# Patient Record
Sex: Female | Born: 1935 | Race: White | Hispanic: No
Health system: Southern US, Community
[De-identification: ages and names within clinical notes are randomized; demographics above are authoritative.]

---

## 2020-01-18 ENCOUNTER — Other Ambulatory Visit (HOSPITAL_COMMUNITY): Payer: Self-pay

## 2020-01-18 ENCOUNTER — Inpatient Hospital Stay
Admission: AD | Admit: 2020-01-18 | Discharge: 2020-02-01 | Disposition: A | Discharge status: Home Health | Payer: Medicare Other | Source: Other Acute Inpatient Hospital | Attending: Internal Medicine | Admitting: Internal Medicine

## 2020-01-18 ENCOUNTER — Inpatient Hospital Stay
Admission: AD | Admit: 2020-01-18 | Payer: Self-pay | Source: Other Acute Inpatient Hospital | Admitting: Internal Medicine

## 2020-01-18 DIAGNOSIS — J189 Pneumonia, unspecified organism: Secondary | ICD-10-CM

## 2020-01-18 DIAGNOSIS — J9690 Respiratory failure, unspecified, unspecified whether with hypoxia or hypercapnia: Secondary | ICD-10-CM

## 2020-01-18 DIAGNOSIS — F419 Anxiety disorder, unspecified: Secondary | ICD-10-CM

## 2020-01-18 DIAGNOSIS — J9621 Acute and chronic respiratory failure with hypoxia: Secondary | ICD-10-CM

## 2020-01-18 DIAGNOSIS — N179 Acute kidney failure, unspecified: Secondary | ICD-10-CM

## 2020-01-18 DIAGNOSIS — A419 Sepsis, unspecified organism: Secondary | ICD-10-CM

## 2020-01-19 DIAGNOSIS — J9621 Acute and chronic respiratory failure with hypoxia: Secondary | ICD-10-CM | POA: Diagnosis not present

## 2020-01-19 DIAGNOSIS — N189 Chronic kidney disease, unspecified: Secondary | ICD-10-CM | POA: Diagnosis not present

## 2020-01-19 DIAGNOSIS — N179 Acute kidney failure, unspecified: Secondary | ICD-10-CM | POA: Diagnosis not present

## 2020-01-19 LAB — CBC
HCT: 28.1 % — ABNORMAL LOW (ref 36.0–46.0)
Hemoglobin: 8.5 g/dL — ABNORMAL LOW (ref 12.0–15.0)
MCH: 28.8 pg (ref 26.0–34.0)
MCHC: 30.2 g/dL (ref 30.0–36.0)
MCV: 95.3 fL (ref 80.0–100.0)
Platelets: 234 10*3/uL (ref 150–400)
RBC: 2.95 MIL/uL — ABNORMAL LOW (ref 3.87–5.11)
RDW: 18.5 % — ABNORMAL HIGH (ref 11.5–15.5)
WBC: 12 10*3/uL — ABNORMAL HIGH (ref 4.0–10.5)
nRBC: 0 % (ref 0.0–0.2)

## 2020-01-19 LAB — PROTIME-INR
INR: 1.1 (ref 0.8–1.2)
Prothrombin Time: 14 seconds (ref 11.4–15.2)

## 2020-01-19 LAB — COMPREHENSIVE METABOLIC PANEL
ALT: 21 U/L (ref 0–44)
AST: 26 U/L (ref 15–41)
Albumin: 2.5 g/dL — ABNORMAL LOW (ref 3.5–5.0)
Alkaline Phosphatase: 77 U/L (ref 38–126)
Anion gap: 9 (ref 5–15)
BUN: 58 mg/dL — ABNORMAL HIGH (ref 8–23)
CO2: 16 mmol/L — ABNORMAL LOW (ref 22–32)
Calcium: 8.5 mg/dL — ABNORMAL LOW (ref 8.9–10.3)
Chloride: 112 mmol/L — ABNORMAL HIGH (ref 98–111)
Creatinine, Ser: 3.08 mg/dL — ABNORMAL HIGH (ref 0.44–1.00)
GFR calc Af Amer: 16 mL/min — ABNORMAL LOW (ref >60)
GFR calc non Af Amer: 13 mL/min — ABNORMAL LOW (ref >60)
Glucose, Bld: 106 mg/dL — ABNORMAL HIGH (ref 70–99)
Potassium: 5.3 mmol/L — ABNORMAL HIGH (ref 3.5–5.1)
Sodium: 137 mmol/L (ref 135–145)
Total Bilirubin: 0.4 mg/dL (ref 0.3–1.2)
Total Protein: 5.5 g/dL — ABNORMAL LOW (ref 6.5–8.1)

## 2020-01-19 NOTE — Consult Note (Signed)
Pulmonary Critical Care Medicine [facility] [place]  PULMONARY SERVICE  Date of Service: 01/19/2020  PULMONARY CRITICAL CARE CONSULT   Linda Wolf  DUK:[id number]  DOB: 1936-01-26   DOA: 01/18/2020  Referring Physician: Carron Curie, MD  HPI: Linda Wolf is a 84 y.o. female seen for follow up of Acute on Chronic Respiratory Failure.  Patient has multiple medical problems including hypertension depression DVT anal cancer presented to the hospital because of increasing respiratory failure.  Patient was found to be septic suspected to have aspiration pneumonia and was treated as such.  Patient had a CT scan as well as a VQ scan for pulmonary embolism which was ruled out.  Also underwent a MRCP without any defects noted.  Apparently urine cultures grew E. coli so patient was treated with antibiotics.  Patient was given a trial of steroids had no major improvement renal function also continued to deteriorate despite management patient was continued on high flow oxygen and presents here for further management  Review of Systems:  ROS performed and is unremarkable other than noted above.  Past medical history hypertension renal failure sepsis anxiety Hyperlipidemia Hypothyroidism Lower extremity swelling Acute renal failure  Past surgical history: Noncontributory  Social history: Positive for tobacco No alcohol or drug abuse  Family history: Noncontributory  Allergies reviewed on the [facility]  Medications: Reviewed on Rounds  Physical Exam:  Vitals: Temperature is 95.8 pulse 85 respiratory 19 blood pressure is 129/81 saturations 100%  Ventilator Settings on high flow oxygen  . General: Comfortable at this time . Eyes: Grossly normal lids, irises & conjunctiva . ENT: grossly tongue is normal . Neck: no obvious mass . Cardiovascular: S1-S2 normal no gallop or rub . Respiratory: Coarse breath sounds noted bilaterally . Abdomen: Soft nontender . Skin: no rash seen  on limited exam . Musculoskeletal: not rigid . Psychiatric:unable to assess . Neurologic: no seizure no involuntary movements         Labs on Admission:  Basic Metabolic Panel: Recent Labs  Lab 01/19/20 0658  NA 137  K 5.3*  CL 112*  CO2 16*  GLUCOSE 106*  BUN 58*  CREATININE 3.08*  CALCIUM 8.5*    No results for input(s): PHART, PCO2ART, PO2ART, HCO3, O2SAT in the last 168 hours.  Liver Function Tests: Recent Labs  Lab 01/19/20 0658  AST 26  ALT 21  ALKPHOS 77  BILITOT 0.4  PROT 5.5*  ALBUMIN 2.5*   No results for input(s): LIPASE, AMYLASE in the last 168 hours. No results for input(s): AMMONIA in the last 168 hours.  CBC: Recent Labs  Lab 01/19/20 0658  WBC 12.0*  HGB 8.5*  HCT 28.1*  MCV 95.3  PLT 234    Cardiac Enzymes: No results for input(s): CKTOTAL, CKMB, CKMBINDEX, TROPONINI in the last 168 hours.  BNP (last 3 results) No results for input(s): BNP in the last 8760 hours.  ProBNP (last 3 results) No results for input(s): PROBNP in the last 8760 hours.   Radiological Exams on Admission: DG Chest Port 1 View  Result Date: 01/18/2020 CLINICAL DATA:  84 year old female with respiratory failure. EXAM: PORTABLE CHEST 1 VIEW COMPARISON:  None. FINDINGS: Small bilateral pleural effusions and bibasilar opacities which may represent atelectasis or infiltrate. No pneumothorax. There is mild cardiomegaly. Atherosclerotic calcification of the aorta. No acute osseous pathology. IMPRESSION: Small bilateral pleural effusions and bibasilar atelectasis or infiltrate. Electronically Signed   By: Elgie Collard M.D.   On: 01/18/2020 18:21    Assessment/Plan Principal  Problem:   Acute on chronic respiratory failure with hypoxia (HCC) Active Problems:   Severe sepsis (HCC)   Acute renal failure superimposed on chronic kidney disease (HCC)   Anxiety disorder   1. Acute on chronic respiratory failure with hypoxia right now patient is on diltiazem on current  settings.  Chest x-ray looks good with small bilateral effusions may be a basilar infiltrate noted. 2. Severe sepsis treated resolved we will continue with supportive care patient apparently completed antibiotics. 3. Acute on chronic renal failure slow improvement we will monitor the patient's fluid status as well as labs. 4. Anxiety disorder patient is on medical management for this we will continue to follow  I have personally seen and evaluated the patient, evaluated laboratory and imaging results, formulated the assessment and plan and placed orders. The Patient requires high complexity decision making with multiple systems involvement.  Case was discussed on Rounds with the Respiratory Therapy Director and the Respiratory staff Time Spent  Yevonne Pax, MD [facility] Pulmonary Critical Care Medicine Sleep Medicine

## 2020-01-20 DIAGNOSIS — N179 Acute kidney failure, unspecified: Secondary | ICD-10-CM | POA: Diagnosis not present

## 2020-01-20 DIAGNOSIS — A419 Sepsis, unspecified organism: Secondary | ICD-10-CM | POA: Diagnosis not present

## 2020-01-20 DIAGNOSIS — F419 Anxiety disorder, unspecified: Secondary | ICD-10-CM | POA: Diagnosis not present

## 2020-01-20 DIAGNOSIS — J9621 Acute and chronic respiratory failure with hypoxia: Secondary | ICD-10-CM | POA: Diagnosis not present

## 2020-01-20 LAB — BASIC METABOLIC PANEL
Anion gap: 10 (ref 5–15)
BUN: 57 mg/dL — ABNORMAL HIGH (ref 8–23)
CO2: 17 mmol/L — ABNORMAL LOW (ref 22–32)
Calcium: 8.7 mg/dL — ABNORMAL LOW (ref 8.9–10.3)
Chloride: 112 mmol/L — ABNORMAL HIGH (ref 98–111)
Creatinine, Ser: 2.89 mg/dL — ABNORMAL HIGH (ref 0.44–1.00)
GFR calc Af Amer: 17 mL/min — ABNORMAL LOW (ref >60)
GFR calc non Af Amer: 15 mL/min — ABNORMAL LOW (ref >60)
Glucose, Bld: 91 mg/dL (ref 70–99)
Potassium: 4.8 mmol/L (ref 3.5–5.1)
Sodium: 139 mmol/L (ref 135–145)

## 2020-01-21 ENCOUNTER — Encounter: Payer: Self-pay | Admitting: Internal Medicine

## 2020-01-21 DIAGNOSIS — R6520 Severe sepsis without septic shock: Secondary | ICD-10-CM

## 2020-01-21 DIAGNOSIS — J9621 Acute and chronic respiratory failure with hypoxia: Secondary | ICD-10-CM

## 2020-01-21 DIAGNOSIS — A419 Sepsis, unspecified organism: Secondary | ICD-10-CM

## 2020-01-21 DIAGNOSIS — N179 Acute kidney failure, unspecified: Secondary | ICD-10-CM

## 2020-01-21 DIAGNOSIS — N189 Chronic kidney disease, unspecified: Secondary | ICD-10-CM

## 2020-01-21 DIAGNOSIS — F419 Anxiety disorder, unspecified: Secondary | ICD-10-CM

## 2020-01-21 HISTORY — DX: Severe sepsis without septic shock: R65.20

## 2020-01-21 HISTORY — DX: Acute kidney failure, unspecified: N17.9

## 2020-01-21 HISTORY — DX: Anxiety disorder, unspecified: F41.9

## 2020-01-21 HISTORY — DX: Chronic kidney disease, unspecified: N18.9

## 2020-01-21 HISTORY — DX: Acute and chronic respiratory failure with hypoxia: J96.21

## 2020-01-21 HISTORY — DX: Sepsis, unspecified organism: A41.9

## 2020-01-21 LAB — POTASSIUM: Potassium: 6.2 mmol/L — ABNORMAL HIGH (ref 3.5–5.1)

## 2020-01-22 ENCOUNTER — Other Ambulatory Visit (HOSPITAL_COMMUNITY): Payer: Self-pay

## 2020-01-22 DIAGNOSIS — J9621 Acute and chronic respiratory failure with hypoxia: Secondary | ICD-10-CM | POA: Diagnosis not present

## 2020-01-22 DIAGNOSIS — A419 Sepsis, unspecified organism: Secondary | ICD-10-CM | POA: Diagnosis not present

## 2020-01-22 DIAGNOSIS — F419 Anxiety disorder, unspecified: Secondary | ICD-10-CM | POA: Diagnosis not present

## 2020-01-22 DIAGNOSIS — R6520 Severe sepsis without septic shock: Secondary | ICD-10-CM

## 2020-01-22 DIAGNOSIS — N179 Acute kidney failure, unspecified: Secondary | ICD-10-CM | POA: Diagnosis not present

## 2020-01-22 LAB — BASIC METABOLIC PANEL
Anion gap: 9 (ref 5–15)
BUN: 50 mg/dL — ABNORMAL HIGH (ref 8–23)
CO2: 23 mmol/L (ref 22–32)
Calcium: 8.2 mg/dL — ABNORMAL LOW (ref 8.9–10.3)
Chloride: 108 mmol/L (ref 98–111)
Creatinine, Ser: 2.4 mg/dL — ABNORMAL HIGH (ref 0.44–1.00)
GFR calc Af Amer: 21 mL/min — ABNORMAL LOW (ref >60)
GFR calc non Af Amer: 18 mL/min — ABNORMAL LOW (ref >60)
Glucose, Bld: 122 mg/dL — ABNORMAL HIGH (ref 70–99)
Potassium: 4.4 mmol/L (ref 3.5–5.1)
Sodium: 140 mmol/L (ref 135–145)

## 2020-01-22 NOTE — Consult Note (Signed)
[facility] CONSULT NOTE    Date: 01/22/2020                  Patient Name:  Linda Wolf  MRN: [id number]  DOB: [birth year]  Age / Sex: 84 y.o., female         PCP: Patient, No Pcp Per                 Service Requesting Consult:  Hospitalist                 Reason for Consult:  Acute kidney injury/hyperkalemia            History of Present Illness: Patient is a 84 y.o. female with a PMHx of hypertension, hyperlipidemia, hypothyroidism, depression, DVT, rectal cancer, recent respiratory failure, UTI, who was admitted to [facility] on 01/18/2020 for ongoing treatment.  Patient is a poor historian and cannot offer significant details regarding her present illness.  She presented to outside hospital on 01/08/2020 with shortness of breath with a sepsis-like picture.  CT of the chest showed bibasilar consolidation at that time.  She was started on Rocephin and Flagyl.  Subsequently blood cultures were positive for E. coli and urine culture was positive for Citrobacter.  She then developed acute kidney injury and was started on IV fluid hydration.  Upon evaluation here her initial creatinine was 3.08.  It did come down a bit on 01/20/2019 down to 2.89.  Yesterday potassium was noted to be a bit high at 6.2.   Medications:  Current medications: D5 half-normal saline, levofloxacin 500 mg IV every 48 hours,    Allergies: No known drug allergies   Past Medical History: Past Medical History:  Diagnosis Date  . Acute on chronic respiratory failure with hypoxia (HCC) 01/21/2020  . Acute renal failure superimposed on chronic kidney disease (HCC) 01/21/2020  . Anxiety disorder 01/21/2020  . Severe sepsis (HCC) 01/21/2020  Hypertension Depression Rectal cancer DVT   Past Surgical History: Unable to obtain from the patient given poor historian status  Family History: Unable to obtain from the patient given poor historian status  Social History: Unable to  obtain from the patient given poor historian status  Review of Systems: Unable to obtain from the patient given poor historian status  Vital Signs: Temperature 96.4 pulse 68 respirations 13 blood pressure 147/84  Weight trends: There were no vitals filed for this visit.   Physical Exam: General:  No acute distress  Head:  Normocephalic, atraumatic. Moist oral mucosal membranes  Eyes:  Anicteric  Neck:  Supple  Lungs:   Clear to auscultation, normal effort  Heart:  S1S2 no rubs  Abdomen:   Soft, nontender, bowel sounds present  Extremities:  1+ peripheral edema.  Neurologic:  Awake, alert, confused  Skin:  No lesions       Lab results: Basic Metabolic Panel: Recent Labs  Lab 01/19/20 0658 01/20/20 0639 01/21/20 1257  NA 137 139  --   K 5.3* 4.8 6.2*  CL 112* 112*  --   CO2 16* 17*  --   GLUCOSE 106* 91  --   BUN 58* 57*  --   CREATININE 3.08* 2.89*  --   CALCIUM 8.5* 8.7*  --     Liver Function Tests: Recent Labs  Lab 01/19/20 0658  AST 26  ALT 21  ALKPHOS 77  BILITOT 0.4  PROT 5.5*  ALBUMIN 2.5*   No results for input(s): LIPASE, AMYLASE in the last  168 hours. No results for input(s): AMMONIA in the last 168 hours.  CBC: Recent Labs  Lab 01/19/20 0658  WBC 12.0*  HGB 8.5*  HCT 28.1*  MCV 95.3  PLT 234    Cardiac Enzymes: No results for input(s): CKTOTAL, CKMB, CKMBINDEX, TROPONINI in the last 168 hours.  BNP: Invalid input(s): POCBNP  CBG: No results for input(s): GLUCAP in the last 168 hours.  Microbiology: No results found for this or any previous visit.  Coagulation Studies: No results for input(s): LABPROT, INR in the last 72 hours.  Urinalysis: No results for input(s): COLORURINE, LABSPEC, PHURINE, GLUCOSEU, HGBUR, BILIRUBINUR, KETONESUR, PROTEINUR, UROBILINOGEN, NITRITE, LEUKOCYTESUR in the last 72 hours.  Invalid input(s): APPERANCEUR    Imaging:  No results found.   Assessment & Plan: Pt is a 84 y.o. female with a  PMHx of hypertension, hyperlipidemia, hypothyroidism, depression, DVT, rectal cancer, recent respiratory failure, UTI, who was admitted to [facility] on 01/18/2020 for ongoing treatment.   1.  Acute kidney injury.  Baseline creatinine unknown to [place] at this time.  Creatinine currently 2.89.  Continue IV fluid hydration.  Check renal ultrasound to make sure no underlying obstruction.  2.  Hyperkalemia.  Serum potassium high at 6.2.  Start the patient on Lokelma 10 g p.o. daily.  Metabolic acidosis is also contributing.  3.  Metabolic acidosis.  Most recent serum bicarbonate was 17.  We plan to repeat this today.  Hold off on adding bicarbonate repletion for now.  4.  Thanks for consultation.

## 2020-01-22 NOTE — Progress Notes (Signed)
Pulmonary Critical Care Medicine [facility] [place]   PULMONARY CRITICAL CARE SERVICE  PROGRESS NOTE  Date of Service: 01/22/2020  Linda Wolf  FXT:[id number]  DOB: [birth year]   DOA: 01/18/2020  Referring Physician: Carron Curie, MD  HPI: Linda Wolf is a 84 y.o. female seen for follow up of Acute on Chronic Respiratory Failure.  Patient is on nasal cannula currently on 6 L of O2 good saturations are noted.  Medications: Reviewed on Rounds  Physical Exam:  Vitals: Temperature 96.4 pulse 60 respiratory 13 blood pressure is 147/84 saturations 96%  Ventilator Settings off the ventilator on nasal cannula  . General: Comfortable at this time . Eyes: Grossly normal lids, irises & conjunctiva . ENT: grossly tongue is normal . Neck: no obvious mass . Cardiovascular: S1 S2 normal no gallop . Respiratory: No rhonchi no rales noted at this time . Abdomen: soft . Skin: no rash seen on limited exam . Musculoskeletal: not rigid . Psychiatric:unable to assess . Neurologic: no seizure no involuntary movements         Lab Data:   Basic Metabolic Panel: Recent Labs  Lab 01/19/20 0658 01/20/20 0639 01/21/20 1257  NA 137 139  --   K 5.3* 4.8 6.2*  CL 112* 112*  --   CO2 16* 17*  --   GLUCOSE 106* 91  --   BUN 58* 57*  --   CREATININE 3.08* 2.89*  --   CALCIUM 8.5* 8.7*  --     ABG: No results for input(s): PHART, PCO2ART, PO2ART, HCO3, O2SAT in the last 168 hours.  Liver Function Tests: Recent Labs  Lab 01/19/20 0658  AST 26  ALT 21  ALKPHOS 77  BILITOT 0.4  PROT 5.5*  ALBUMIN 2.5*   No results for input(s): LIPASE, AMYLASE in the last 168 hours. No results for input(s): AMMONIA in the last 168 hours.  CBC: Recent Labs  Lab 01/19/20 0658  WBC 12.0*  HGB 8.5*  HCT 28.1*  MCV 95.3  PLT 234    Cardiac Enzymes: No results for input(s): CKTOTAL, CKMB, CKMBINDEX, TROPONINI in the last 168 hours.  BNP (last 3 results) No results for  input(s): BNP in the last 8760 hours.  ProBNP (last 3 results) No results for input(s): PROBNP in the last 8760 hours.  Radiological Exams: No results found.  Assessment/Plan Principal Problem:   Acute on chronic respiratory failure with hypoxia (HCC) Active Problems:   Severe sepsis (HCC)   Acute renal failure superimposed on chronic kidney disease (HCC)   Anxiety disorder   1. Acute on chronic respiratory failure with hypoxia plan is to continue to wean FiO2 now is on regular nasal cannula. 2. Severe sepsis resolved 3. Acute renal failure following the labs patient's last creatinine was 2.89 4. Anxiety disorder at baseline we will continue to monitor   I have personally seen and evaluated the patient, evaluated laboratory and imaging results, formulated the assessment and plan and placed orders. The Patient requires high complexity decision making with multiple systems involvement.  Rounds were done with the Respiratory Therapy Director and Staff therapists and discussed with nursing staff also.  Yevonne Pax, MD [facility] Pulmonary Critical Care Medicine Sleep Medicine

## 2020-01-23 LAB — PROTEIN ELECTROPHORESIS, SERUM
A/G Ratio: 1 (ref 0.7–1.7)
Albumin ELP: 2.8 g/dL — ABNORMAL LOW (ref 2.9–4.4)
Alpha-1-Globulin: 0.3 g/dL (ref 0.0–0.4)
Alpha-2-Globulin: 0.8 g/dL (ref 0.4–1.0)
Beta Globulin: 0.8 g/dL (ref 0.7–1.3)
Gamma Globulin: 0.8 g/dL (ref 0.4–1.8)
Globulin, Total: 2.8 g/dL (ref 2.2–3.9)
Total Protein ELP: 5.6 g/dL — ABNORMAL LOW (ref 6.0–8.5)

## 2020-01-23 LAB — ANA W/REFLEX IF POSITIVE: Anti Nuclear Antibody (ANA): NEGATIVE

## 2020-01-23 NOTE — Progress Notes (Addendum)
Pulmonary Critical Care Medicine [facility] [place]   PULMONARY CRITICAL CARE SERVICE  PROGRESS NOTE  Date of Service: 01/23/2020  Lorra Freeman  PYK:[id number]  DOB: Nov 23, 1935   DOA: 01/18/2020  Referring Physician: Carron Curie, MD  HPI: Wileen Duncanson is a 84 y.o. female seen for follow up of Acute on Chronic Respiratory Failure.  Patient remains on 8 L Oxymizer at this time satting well with no fever or distress.  Medications: Reviewed on Rounds  Physical Exam:  Vitals: Pulse 91 respirations 19 BP 161/66 O2 sat 100% temp 96.5  Ventilator Settings 8 L Oxymizer  . General: Comfortable at this time . Eyes: Grossly normal lids, irises & conjunctiva . ENT: grossly tongue is normal . Neck: no obvious mass . Cardiovascular: S1 S2 normal no gallop . Respiratory: No rales or rhonchi noted . Abdomen: soft . Skin: no rash seen on limited exam . Musculoskeletal: not rigid . Psychiatric:unable to assess . Neurologic: no seizure no involuntary movements         Lab Data:   Basic Metabolic Panel: Recent Labs  Lab 01/19/20 0658 01/20/20 0639 01/21/20 1257 01/22/20 1245  NA 137 139  --  140  K 5.3* 4.8 6.2* 4.4  CL 112* 112*  --  108  CO2 16* 17*  --  23  GLUCOSE 106* 91  --  122*  BUN 58* 57*  --  50*  CREATININE 3.08* 2.89*  --  2.40*  CALCIUM 8.5* 8.7*  --  8.2*    ABG: No results for input(s): PHART, PCO2ART, PO2ART, HCO3, O2SAT in the last 168 hours.  Liver Function Tests: Recent Labs  Lab 01/19/20 0658  AST 26  ALT 21  ALKPHOS 77  BILITOT 0.4  PROT 5.5*  ALBUMIN 2.5*   No results for input(s): LIPASE, AMYLASE in the last 168 hours. No results for input(s): AMMONIA in the last 168 hours.  CBC: Recent Labs  Lab 01/19/20 0658  WBC 12.0*  HGB 8.5*  HCT 28.1*  MCV 95.3  PLT 234    Cardiac Enzymes: No results for input(s): CKTOTAL, CKMB, CKMBINDEX, TROPONINI in the last 168 hours.  BNP (last 3 results) No results for input(s):  BNP in the last 8760 hours.  ProBNP (last 3 results) No results for input(s): PROBNP in the last 8760 hours.  Radiological Exams: US RENAL  Result Date: 01/22/2020 CLINICAL DATA:  Acute renal injury EXAM: RENAL / URINARY TRACT ULTRASOUND COMPLETE COMPARISON:  None. FINDINGS: Right Kidney: Renal measurements: 8.3 x 4.0 x 4.6 cm. = volume: 80.5 mL. Mild increased echogenicity is noted. Mild cortical thinning is seen. No obstructive changes are noted. Left Kidney: Renal measurements: 8.4 x 3.7 x 4.2 cm. = volume: 65 mL. Mild increased echogenicity is noted. Mild cortical thinning is noted. No obstructive changes are noted. Bladder: Decompressed by Foley catheter. Other: None. IMPRESSION: Changes of medical renal disease.  No acute abnormality noted. Electronically Signed   By: Alcide Clever M.D.   On: 01/22/2020 19:11    Assessment/Plan Principal Problem:   Acute on chronic respiratory failure with hypoxia (HCC) Active Problems:   Severe sepsis (HCC)   Acute renal failure superimposed on chronic kidney disease (HCC)   Anxiety disorder   1. Acute on chronic respiratory failure with hypoxia continue to wean oxygen as tolerated.  Currently on 8 L Oxymizer satting well continue supportive measures and pulmonary toilet. 2. Severe sepsis treated resolved we will continue with supportive care patient apparently completed antibiotics. 3. Acute  on chronic renal failure slow improvement we will monitor the patient's fluid status as well as labs. 4. Anxiety disorder patient is on medical management for this we will continue to follow   I have personally seen and evaluated the patient, evaluated laboratory and imaging results, formulated the assessment and plan and placed orders. The Patient requires high complexity decision making with multiple systems involvement.  Rounds were done with the Respiratory Therapy Director and Staff therapists and discussed with nursing staff also.  Yevonne Pax, MD  [facility] Pulmonary Critical Care Medicine Sleep Medicine

## 2020-01-24 ENCOUNTER — Other Ambulatory Visit (HOSPITAL_COMMUNITY): Payer: Self-pay

## 2020-01-24 DIAGNOSIS — F419 Anxiety disorder, unspecified: Secondary | ICD-10-CM | POA: Diagnosis not present

## 2020-01-24 DIAGNOSIS — J9621 Acute and chronic respiratory failure with hypoxia: Secondary | ICD-10-CM | POA: Diagnosis not present

## 2020-01-24 DIAGNOSIS — N179 Acute kidney failure, unspecified: Secondary | ICD-10-CM | POA: Diagnosis not present

## 2020-01-24 DIAGNOSIS — A419 Sepsis, unspecified organism: Secondary | ICD-10-CM | POA: Diagnosis not present

## 2020-01-24 LAB — CBC
HCT: 26.7 % — ABNORMAL LOW (ref 36.0–46.0)
Hemoglobin: 8.3 g/dL — ABNORMAL LOW (ref 12.0–15.0)
MCH: 29.1 pg (ref 26.0–34.0)
MCHC: 31.1 g/dL (ref 30.0–36.0)
MCV: 93.7 fL (ref 80.0–100.0)
Platelets: 212 10*3/uL (ref 150–400)
RBC: 2.85 MIL/uL — ABNORMAL LOW (ref 3.87–5.11)
RDW: 17.6 % — ABNORMAL HIGH (ref 11.5–15.5)
WBC: 8.6 10*3/uL (ref 4.0–10.5)
nRBC: 0 % (ref 0.0–0.2)

## 2020-01-24 LAB — BASIC METABOLIC PANEL
Anion gap: 9 (ref 5–15)
BUN: 50 mg/dL — ABNORMAL HIGH (ref 8–23)
CO2: 23 mmol/L (ref 22–32)
Calcium: 8.1 mg/dL — ABNORMAL LOW (ref 8.9–10.3)
Chloride: 110 mmol/L (ref 98–111)
Creatinine, Ser: 2.15 mg/dL — ABNORMAL HIGH (ref 0.44–1.00)
GFR calc Af Amer: 24 mL/min — ABNORMAL LOW (ref >60)
GFR calc non Af Amer: 21 mL/min — ABNORMAL LOW (ref >60)
Glucose, Bld: 98 mg/dL (ref 70–99)
Potassium: 4.1 mmol/L (ref 3.5–5.1)
Sodium: 142 mmol/L (ref 135–145)

## 2020-01-24 LAB — PROTEIN / CREATININE RATIO, URINE
Creatinine, Urine: 51.32 mg/dL
Protein Creatinine Ratio: 0.58 mg/mg{Cre} — ABNORMAL HIGH (ref 0.00–0.15)
Total Protein, Urine: 30 mg/dL

## 2020-01-24 NOTE — Progress Notes (Signed)
Pulmonary Critical Care Medicine [facility] [place]   PULMONARY CRITICAL CARE SERVICE  PROGRESS NOTE  Date of Service: 01/24/2020  Linda Wolf  POE:[id number]  DOB: 1936-02-16   DOA: 01/18/2020  Referring Physician: Carron Curie, MD  HPI: Linda Wolf is a 84 y.o. female seen for follow up of Acute on Chronic Respiratory Failure.  Patient is off the ventilator on 2 L of oxygen good saturations are noted.  Medications: Reviewed on Rounds  Physical Exam:  Vitals: Temperature 96.5 pulse 90 respiratory 14 blood pressure is 121/78 saturations 93%  Ventilator Settings 2 L of O2  . General: Comfortable at this time . Eyes: Grossly normal lids, irises & conjunctiva . ENT: grossly tongue is normal . Neck: no obvious mass . Cardiovascular: S1 S2 normal no gallop . Respiratory: No rhonchi coarse breath sounds . Abdomen: soft . Skin: no rash seen on limited exam . Musculoskeletal: not rigid . Psychiatric:unable to assess . Neurologic: no seizure no involuntary movements         Lab Data:   Basic Metabolic Panel: Recent Labs  Lab 01/19/20 0658 01/20/20 0639 01/21/20 1257 01/22/20 1245 01/24/20 1112  NA 137 139  --  140 142  K 5.3* 4.8 6.2* 4.4 4.1  CL 112* 112*  --  108 110  CO2 16* 17*  --  23 23  GLUCOSE 106* 91  --  122* 98  BUN 58* 57*  --  50* 50*  CREATININE 3.08* 2.89*  --  2.40* 2.15*  CALCIUM 8.5* 8.7*  --  8.2* 8.1*    ABG: No results for input(s): PHART, PCO2ART, PO2ART, HCO3, O2SAT in the last 168 hours.  Liver Function Tests: Recent Labs  Lab 01/19/20 0658  AST 26  ALT 21  ALKPHOS 77  BILITOT 0.4  PROT 5.5*  ALBUMIN 2.5*   No results for input(s): LIPASE, AMYLASE in the last 168 hours. No results for input(s): AMMONIA in the last 168 hours.  CBC: Recent Labs  Lab 01/19/20 0658 01/24/20 1112  WBC 12.0* 8.6  HGB 8.5* 8.3*  HCT 28.1* 26.7*  MCV 95.3 93.7  PLT 234 212    Cardiac Enzymes: No results for input(s):  CKTOTAL, CKMB, CKMBINDEX, TROPONINI in the last 168 hours.  BNP (last 3 results) No results for input(s): BNP in the last 8760 hours.  ProBNP (last 3 results) No results for input(s): PROBNP in the last 8760 hours.  Radiological Exams: US RENAL  Result Date: 01/22/2020 CLINICAL DATA:  Acute renal injury EXAM: RENAL / URINARY TRACT ULTRASOUND COMPLETE COMPARISON:  None. FINDINGS: Right Kidney: Renal measurements: 8.3 x 4.0 x 4.6 cm. = volume: 80.5 mL. Mild increased echogenicity is noted. Mild cortical thinning is seen. No obstructive changes are noted. Left Kidney: Renal measurements: 8.4 x 3.7 x 4.2 cm. = volume: 65 mL. Mild increased echogenicity is noted. Mild cortical thinning is noted. No obstructive changes are noted. Bladder: Decompressed by Foley catheter. Other: None. IMPRESSION: Changes of medical renal disease.  No acute abnormality noted. Electronically Signed   By: Alcide Clever M.D.   On: 01/22/2020 19:11    Assessment/Plan Principal Problem:   Acute on chronic respiratory failure with hypoxia (HCC) Active Problems:   Severe sepsis (HCC)   Acute renal failure superimposed on chronic kidney disease (HCC)   Anxiety disorder   1. Acute on chronic respiratory failure hypoxia plan is to continue with oxygen therapy right now is on 2 L 2. Severe sepsis resolved hemodynamics are stable. 3.  Acute renal failure improving renal function 4. Anxiety disorder under control   I have personally seen and evaluated the patient, evaluated laboratory and imaging results, formulated the assessment and plan and placed orders. The Patient requires high complexity decision making with multiple systems involvement.  Rounds were done with the Respiratory Therapy Director and Staff therapists and discussed with nursing staff also.  Yevonne Pax, MD [facility] Pulmonary Critical Care Medicine Sleep Medicine

## 2020-01-25 ENCOUNTER — Other Ambulatory Visit (HOSPITAL_COMMUNITY): Payer: Self-pay

## 2020-01-25 LAB — PROTEIN ELECTRO, RANDOM URINE
Albumin ELP, Urine: 32.3 %
Alpha-1-Globulin, U: 9.5 %
Alpha-2-Globulin, U: 12.1 %
Beta Globulin, U: 24.1 %
Gamma Globulin, U: 22 %
Total Protein, Urine: 16.9 mg/dL

## 2020-01-26 LAB — BASIC METABOLIC PANEL
Anion gap: 10 (ref 5–15)
BUN: 38 mg/dL — ABNORMAL HIGH (ref 8–23)
CO2: 25 mmol/L (ref 22–32)
Calcium: 8.4 mg/dL — ABNORMAL LOW (ref 8.9–10.3)
Chloride: 107 mmol/L (ref 98–111)
Creatinine, Ser: 1.76 mg/dL — ABNORMAL HIGH (ref 0.44–1.00)
GFR calc Af Amer: 31 mL/min — ABNORMAL LOW (ref >60)
GFR calc non Af Amer: 26 mL/min — ABNORMAL LOW (ref >60)
Glucose, Bld: 114 mg/dL — ABNORMAL HIGH (ref 70–99)
Potassium: 4.3 mmol/L (ref 3.5–5.1)
Sodium: 142 mmol/L (ref 135–145)

## 2020-01-30 LAB — BASIC METABOLIC PANEL
Anion gap: 10 (ref 5–15)
BUN: 40 mg/dL — ABNORMAL HIGH (ref 8–23)
CO2: 25 mmol/L (ref 22–32)
Calcium: 8.5 mg/dL — ABNORMAL LOW (ref 8.9–10.3)
Chloride: 104 mmol/L (ref 98–111)
Creatinine, Ser: 1.74 mg/dL — ABNORMAL HIGH (ref 0.44–1.00)
GFR calc Af Amer: 31 mL/min — ABNORMAL LOW (ref >60)
GFR calc non Af Amer: 27 mL/min — ABNORMAL LOW (ref >60)
Glucose, Bld: 142 mg/dL — ABNORMAL HIGH (ref 70–99)
Potassium: 3.6 mmol/L (ref 3.5–5.1)
Sodium: 139 mmol/L (ref 135–145)

## 2020-01-30 LAB — CBC
HCT: 30.2 % — ABNORMAL LOW (ref 36.0–46.0)
Hemoglobin: 9.4 g/dL — ABNORMAL LOW (ref 12.0–15.0)
MCH: 29.1 pg (ref 26.0–34.0)
MCHC: 31.1 g/dL (ref 30.0–36.0)
MCV: 93.5 fL (ref 80.0–100.0)
Platelets: 219 10*3/uL (ref 150–400)
RBC: 3.23 MIL/uL — ABNORMAL LOW (ref 3.87–5.11)
RDW: 16.2 % — ABNORMAL HIGH (ref 11.5–15.5)
WBC: 7.4 10*3/uL (ref 4.0–10.5)
nRBC: 0 % (ref 0.0–0.2)

## 2020-01-31 LAB — SARS CORONAVIRUS 2 (TAT 6-24 HRS): SARS Coronavirus 2: NEGATIVE

## 2021-02-01 IMAGING — US US RENAL
1 series · 14 of 21 positions shown · non-contrast
Comparison: None.

CLINICAL DATA: Acute renal injury

EXAM:
RENAL / URINARY TRACT ULTRASOUND COMPLETE

[Series 1: us renal · 14 of 21 slices shown]
[im 1/21]
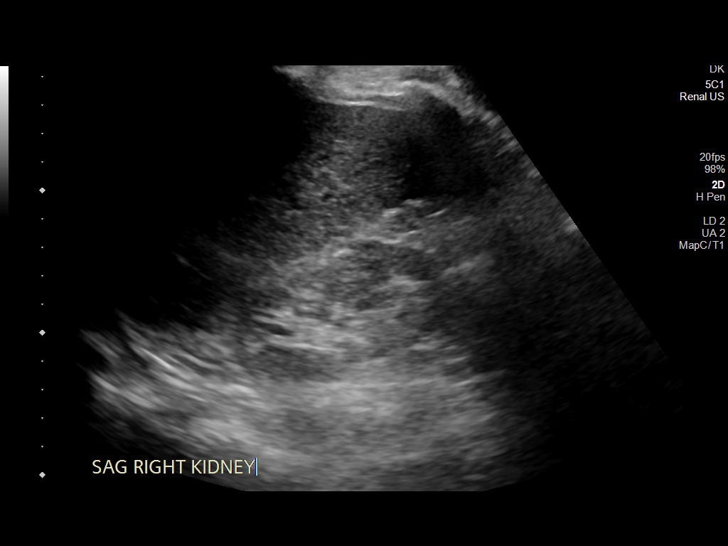
[im 3/21]
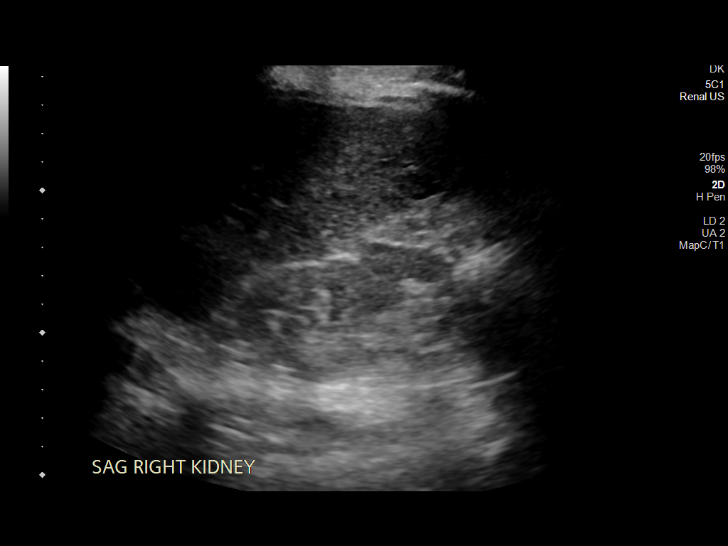
[im 4/21]
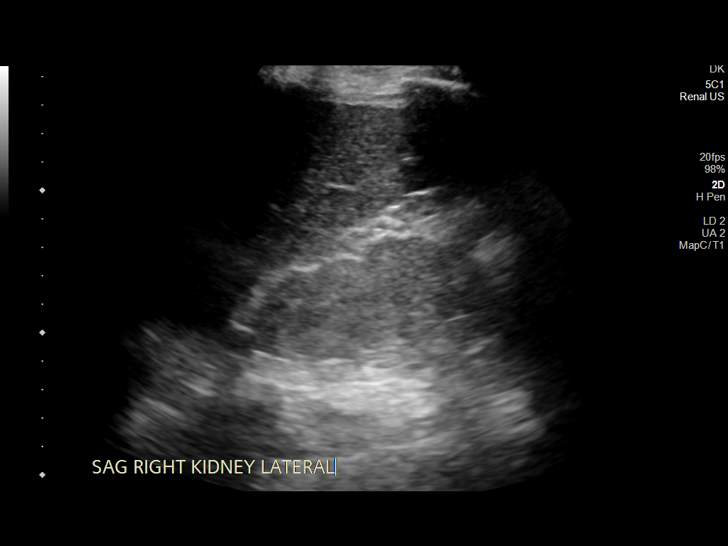
[im 6/21]
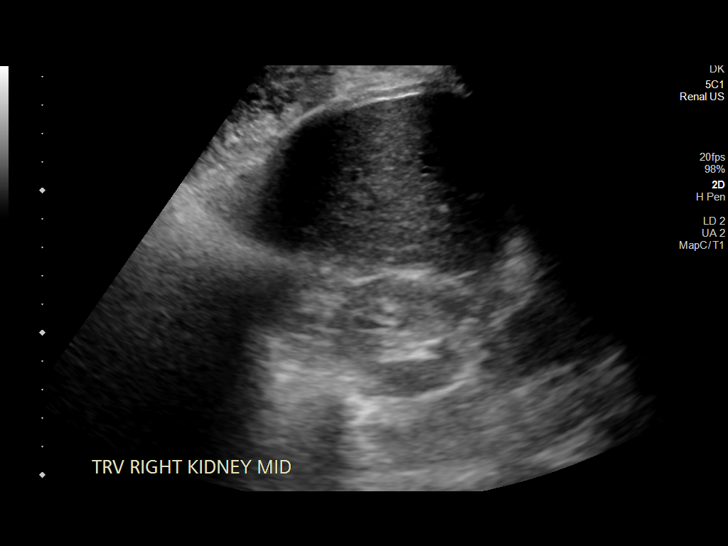
[im 7/21]
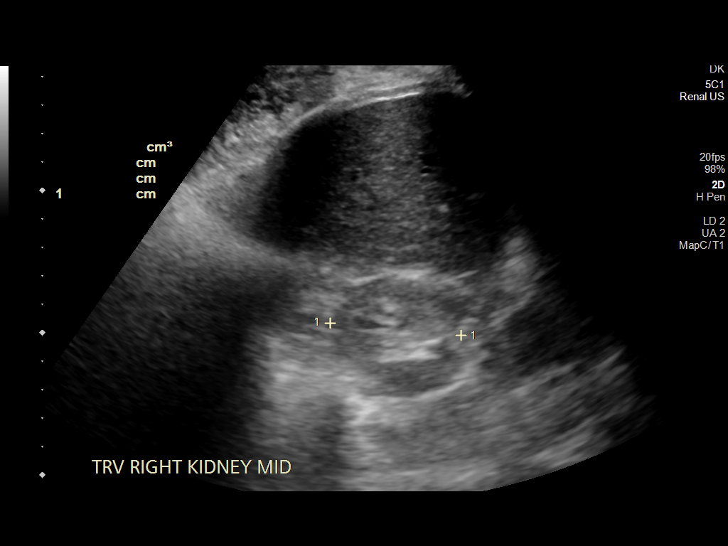
[im 9/21]
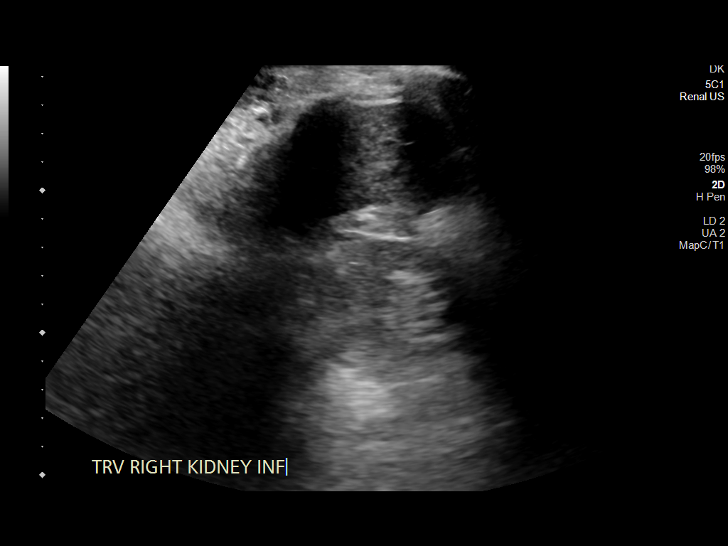
[im 10/21]
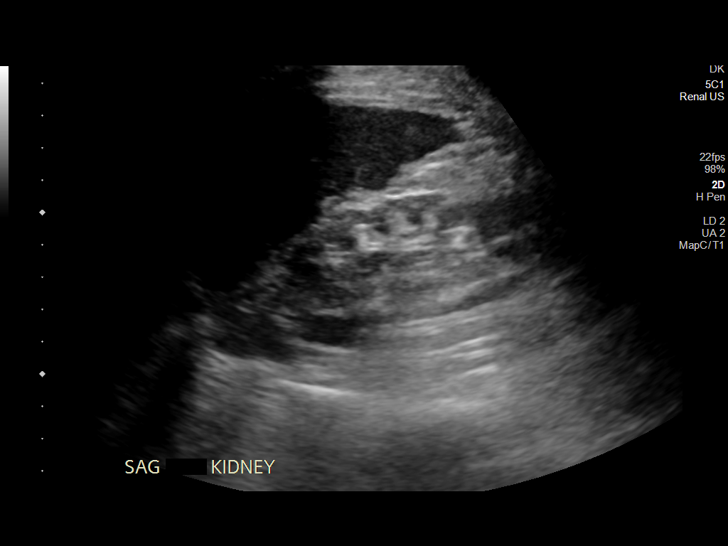
[im 12/21]
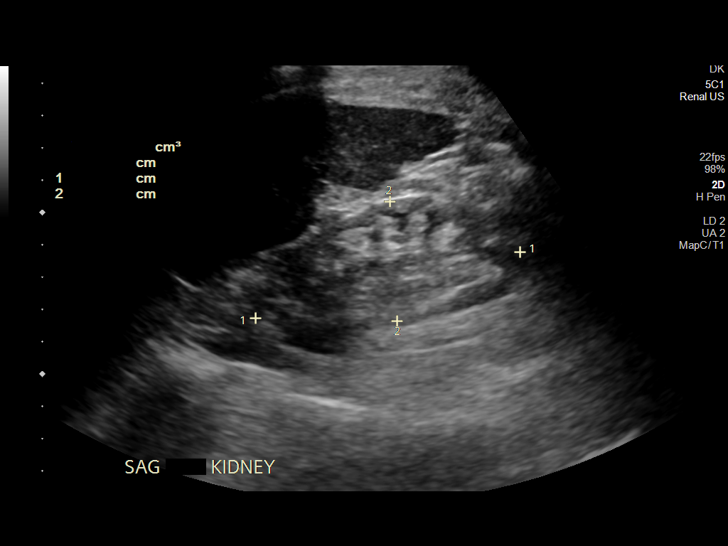
[im 13/21]
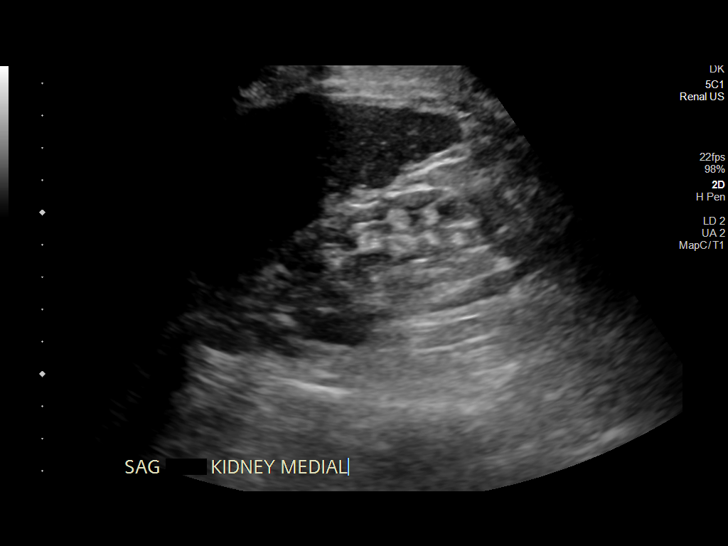
[im 15/21]
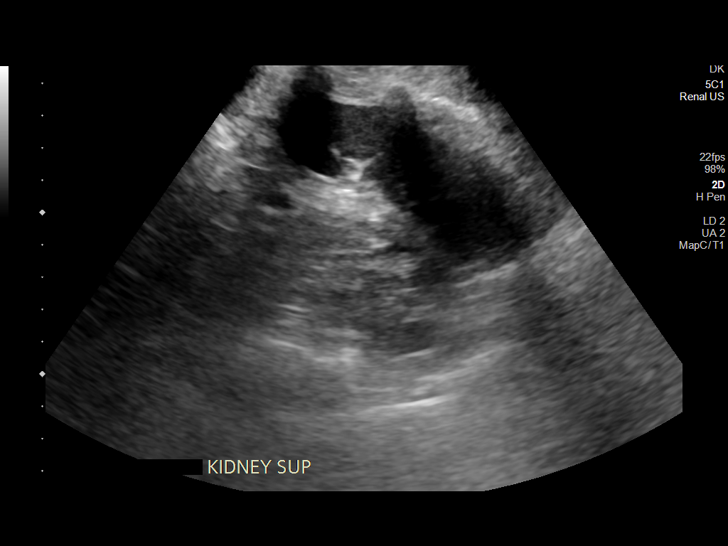
[im 16/21]
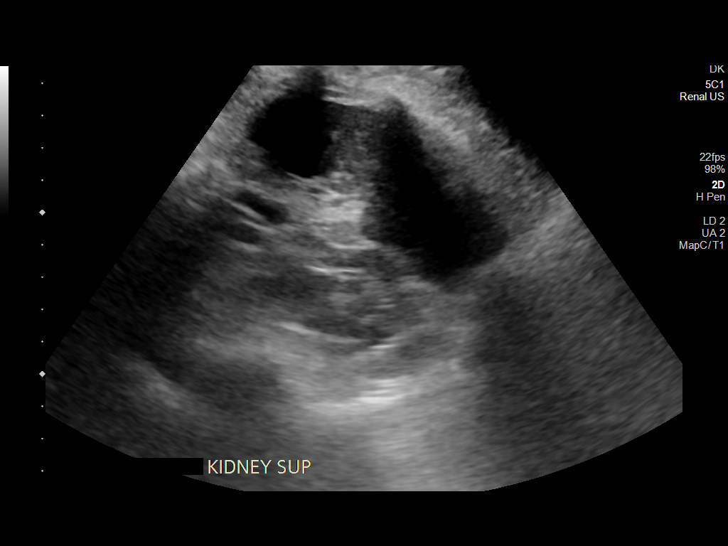
[im 18/21]
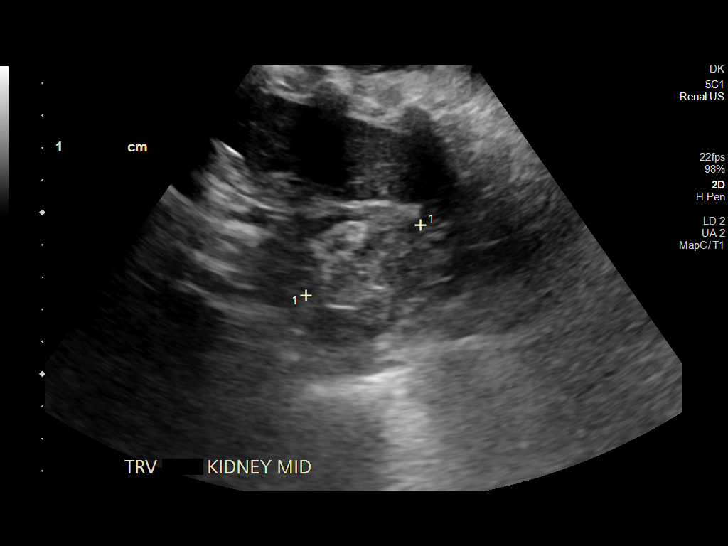
[im 19/21]
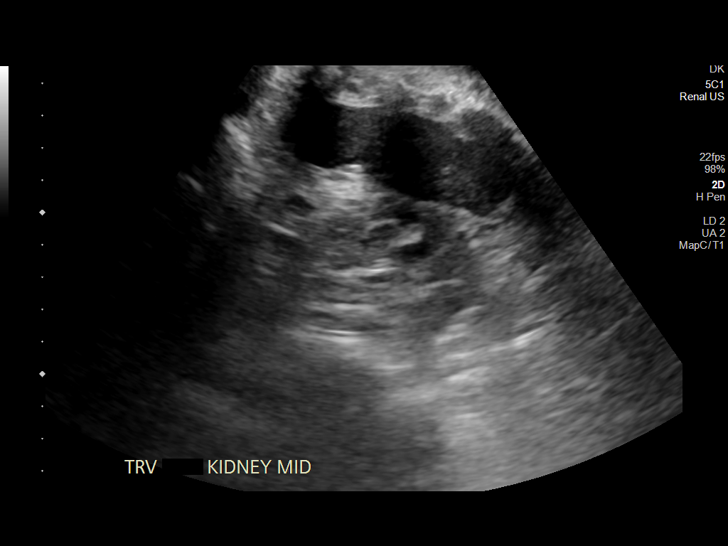
[im 21/21]
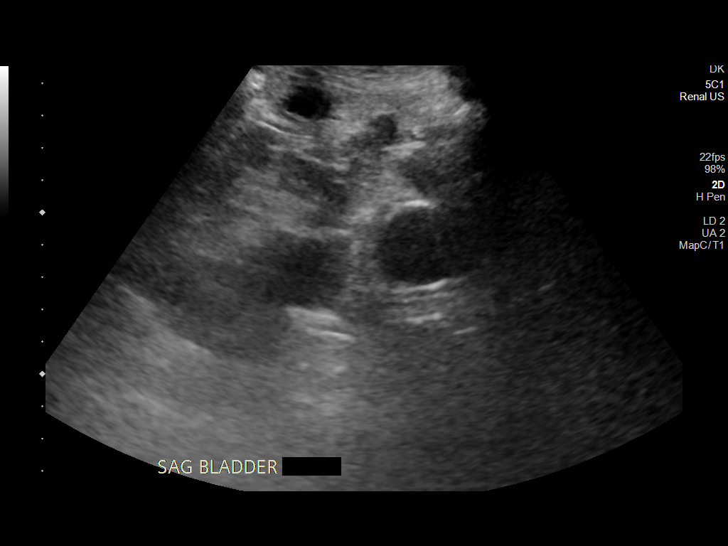

[14 of 21 positions shown; findings below may reference images not displayed]

FINDINGS: Right Kidney:

Renal measurements: 8.3 x 4.0 x 4.6 cm. = volume: 80.5 mL. Mild
increased echogenicity is noted. Mild cortical thinning is seen. No
obstructive changes are noted.

Left Kidney:

Renal measurements: 8.4 x 3.7 x 4.2 cm. = volume: 65 mL. Mild
increased echogenicity is noted. Mild cortical thinning is noted. No
obstructive changes are noted.

Bladder:

Decompressed by Foley catheter.

Other:

None.
IMPRESSION: Changes of medical renal disease.  No acute abnormality noted.

## 2021-02-04 IMAGING — DX DG CHEST 1V PORT
1 series · 1 of 1 positions shown · non-contrast
Comparison: 01/18/2020.

CLINICAL DATA: Pneumonia.

EXAM:
PORTABLE CHEST 1 VIEW

[chest]
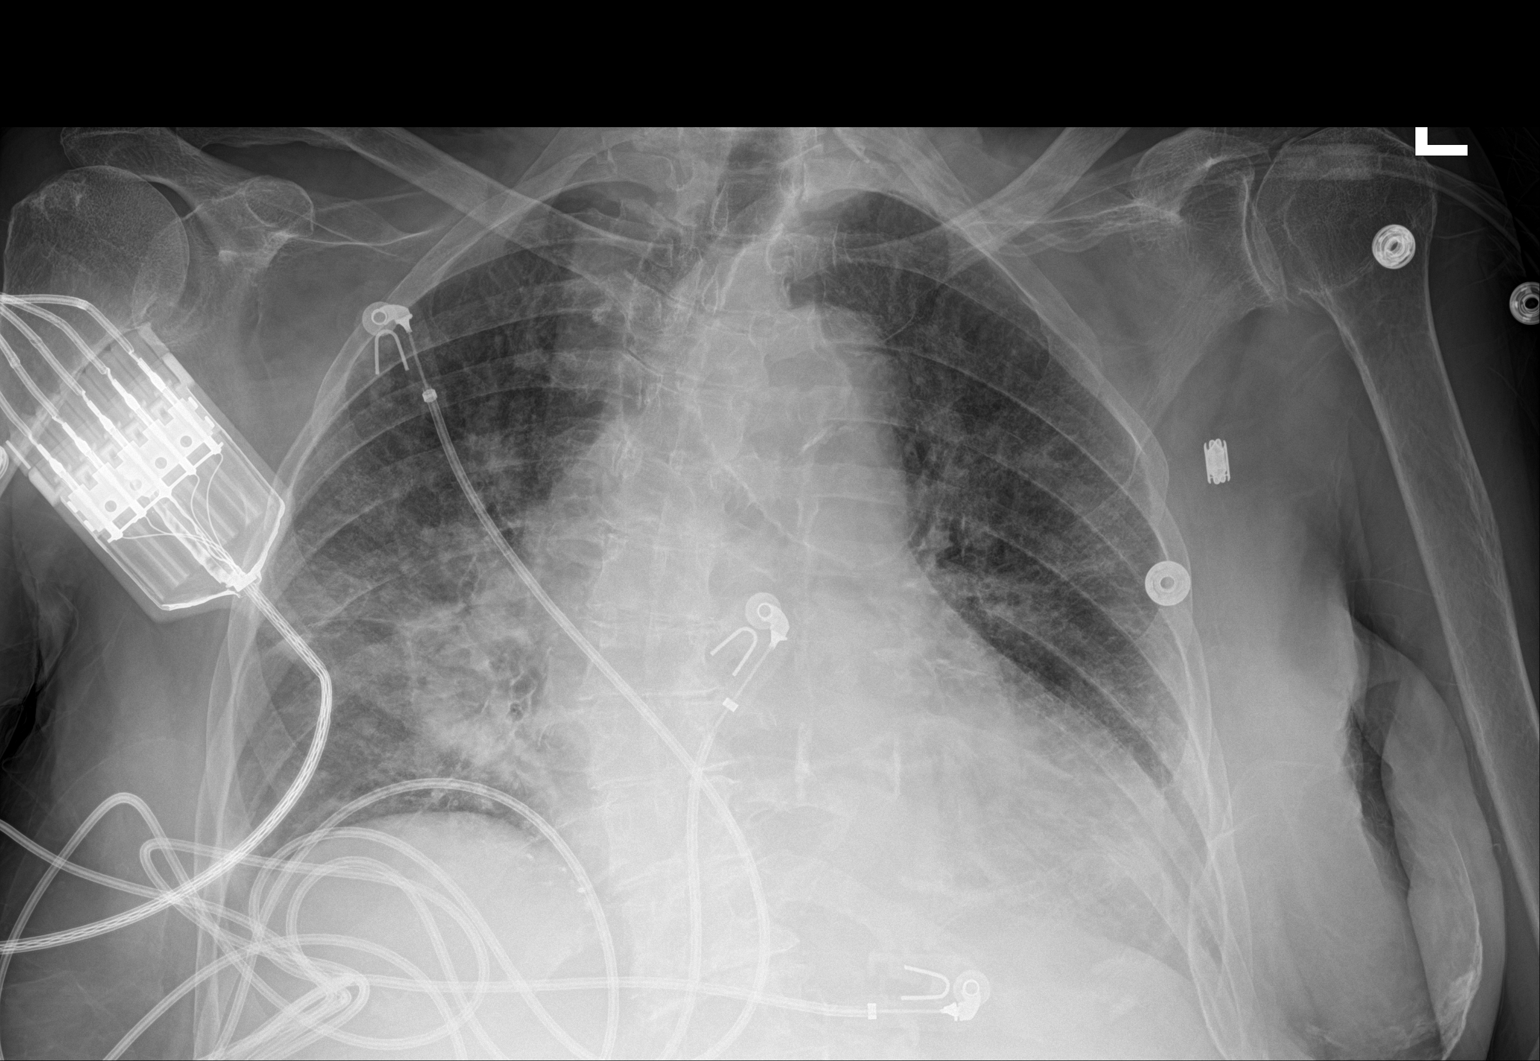

[1 of 1 positions shown; findings below may reference images not displayed]

FINDINGS: Mediastinum and hilar structures normal. Stable cardiomegaly.
Persistent right base atelectasis and infiltrate with slight
improvement in aeration from prior exam. Tiny right pleural effusion
cannot be excluded. No pneumothorax. Left breast implant.
IMPRESSION: 1.  Stable cardiomegaly.

2. Persistent right base atelectasis and infiltrate with slight
improvement in aeration from prior exam. Tiny right pleural effusion
cannot be excluded.
